# Patient Record
Sex: Male | Born: 1995 | Race: Black or African American | Hispanic: No | Marital: Single | Smoking: Never smoker
Health system: Southern US, Community
[De-identification: ages and names within clinical notes are randomized; demographics above are authoritative.]

## PROBLEM LIST (undated history)

## (undated) DIAGNOSIS — J45909 Unspecified asthma, uncomplicated: Secondary | ICD-10-CM

---

## 2018-03-29 ENCOUNTER — Encounter (HOSPITAL_COMMUNITY): Payer: Self-pay | Admitting: *Deleted

## 2018-03-29 ENCOUNTER — Emergency Department (HOSPITAL_COMMUNITY)
Admission: EM | Admit: 2018-03-29 | Discharge: 2018-03-29 | Disposition: A | Discharge status: Home / Self Care | Payer: BLUE CROSS/BLUE SHIELD | Attending: Emergency Medicine | Admitting: Emergency Medicine

## 2018-03-29 DIAGNOSIS — J069 Acute upper respiratory infection, unspecified: Secondary | ICD-10-CM | POA: Insufficient documentation

## 2018-03-29 DIAGNOSIS — R05 Cough: Secondary | ICD-10-CM | POA: Insufficient documentation

## 2018-03-29 DIAGNOSIS — J45909 Unspecified asthma, uncomplicated: Secondary | ICD-10-CM | POA: Insufficient documentation

## 2018-03-29 DIAGNOSIS — R0981 Nasal congestion: Secondary | ICD-10-CM | POA: Diagnosis present

## 2018-03-29 DIAGNOSIS — B9789 Other viral agents as the cause of diseases classified elsewhere: Secondary | ICD-10-CM

## 2018-03-29 HISTORY — DX: Unspecified asthma, uncomplicated: J45.909

## 2018-03-29 NOTE — ED Provider Notes (Signed)
  [facility] EMERGENCY DEPARTMENT Provider Note   CSN: [id number] Arrival date & time: 03/29/18  1307     History   Chief Complaint Chief Complaint  Patient presents with  . URI    HPI   Randy Hinton is a 22 y.o. male who complains of congestion, sore throat, swollen glands, dry cough, myalgias and headache for 2 days. He denies a history of anorexia, chest pain, fevers, nausea, vomiting and weight loss and denies a history of asthma. Patient does not smoke cigarettes.     HPI  Past Medical History:  Diagnosis Date  . Asthma     There are no active problems to display for this patient.   History reviewed. No pertinent surgical history.      Home Medications    Prior to Admission medications   Not on File    Family History History reviewed. No pertinent family history.  Social History Social History   Tobacco Use  . Smoking status: Never Smoker  . Smokeless tobacco: Never Used  Substance Use Topics  . Alcohol use: Not on file  . Drug use: Not on file     Allergies   Patient has no known allergies.   Review of Systems Review of Systems  Ten systems reviewed and are negative for acute change, except as noted in the HPI.   Physical Exam Updated Vital Signs BP 122/83 (BP Location: Right Arm)   Pulse 91   Temp 98 F (36.7 C) (Oral)   Resp 20   SpO2 100%   Physical Exam  Constitutional: He appears well-developed and well-nourished. No distress.  Moderately ill but nontoxic  HENT:  Head: Normocephalic and atraumatic.  Mild pharyngeal erythema without exudate Uvula midline  Eyes: Conjunctivae are normal. No scleral icterus.  Neck: Normal range of motion. Neck supple.  Cardiovascular: Normal rate, regular rhythm and normal heart sounds.  Pulmonary/Chest: Effort normal and breath sounds normal. No respiratory distress. He has no wheezes. He has no rales.  Abdominal: Soft. He exhibits no distension and no mass. There is no  tenderness. There is no guarding.  Musculoskeletal: He exhibits no edema.  Neurological: He is alert.  Skin: Skin is warm and dry. He is not diaphoretic.  Psychiatric: His behavior is normal.  Nursing note and vitals reviewed.    ED Treatments / Results  Labs (all labs ordered are listed, but only abnormal results are displayed) Labs Reviewed - No data to display  EKG None  Radiology No results found.  Procedures Procedures (including critical care time)  Medications Ordered in ED Medications - No data to display   Initial Impression / Assessment and Plan / ED Course  I have reviewed the triage vital signs and the nursing notes.  Pertinent labs & imaging results that were available during my care of the patient were reviewed by me and considered in my medical decision making (see chart for details).     Patients symptoms are consistent with URI, likely viral etiology. Discussed that antibiotics are not indicated for viral infections. Pt will be discharged with symptomatic treatment.  Verbalizes understanding and is agreeable with plan. Pt is hemodynamically stable & in NAD prior to dc.   Final Clinical Impressions(s) / ED Diagnoses   Final diagnoses:  Viral URI with cough    ED Discharge Orders    None       Arthor Captain, PA-C 03/29/18 1452    Linwood Dibbles, MD 03/31/18 [phone]

## 2018-03-29 NOTE — ED Triage Notes (Signed)
Pt in c/o headache, sore throat, body aches and congestion, mild cough, hurts to take deep breaths, no distress noted

## 2018-03-29 NOTE — Discharge Instructions (Addendum)
You appear to have an upper respiratory infection (URI). An upper respiratory tract infection, or cold, is a viral infection of the air passages leading to the lungs. It is contagious and can be spread to others, especially during the first 3 or 4 days. It cannot be cured by antibiotics or other medicines. °RETURN IMMEDIATELY IF you develop shortness of breath, confusion or altered mental status, a new rash, become dizzy, faint, or poorly responsive, or are unable to be cared for at home. ° °

## 2018-04-11 ENCOUNTER — Encounter (HOSPITAL_COMMUNITY): Payer: Self-pay

## 2018-04-11 ENCOUNTER — Emergency Department (HOSPITAL_COMMUNITY)
Admission: EM | Admit: 2018-04-11 | Discharge: 2018-04-11 | Disposition: A | Discharge status: Home / Self Care | Payer: BLUE CROSS/BLUE SHIELD | Attending: Emergency Medicine | Admitting: Emergency Medicine

## 2018-04-11 ENCOUNTER — Other Ambulatory Visit: Payer: Self-pay

## 2018-04-11 DIAGNOSIS — J029 Acute pharyngitis, unspecified: Secondary | ICD-10-CM | POA: Insufficient documentation

## 2018-04-11 DIAGNOSIS — J45909 Unspecified asthma, uncomplicated: Secondary | ICD-10-CM | POA: Insufficient documentation

## 2018-04-11 LAB — GROUP A STREP BY PCR: Group A Strep by PCR: NOT DETECTED

## 2018-04-11 MED ORDER — OXYCODONE-ACETAMINOPHEN 5-325 MG PO TABS
1.0000 | ORAL_TABLET | Freq: Once | ORAL | Status: AC
  Administered 2018-04-11: 1 via ORAL
  Filled 2018-04-11: qty 1

## 2018-04-11 MED ORDER — DEXAMETHASONE 4 MG PO TABS
10.0000 mg | ORAL_TABLET | Freq: Once | ORAL | Status: AC
  Administered 2018-04-11: 10 mg via ORAL
  Filled 2018-04-11: qty 3

## 2018-04-11 NOTE — Discharge Instructions (Addendum)
Please read attached information. If you experience any new or worsening signs or symptoms please return to the emergency room for evaluation. Please follow-up with your primary care provider or specialist as discussed. Please use medication prescribed only as directed and discontinue taking if you have any concerning signs or symptoms.   °

## 2018-04-11 NOTE — ED Provider Notes (Signed)
[facility] EMERGENCY DEPARTMENT Provider Note   CSN: [id number] Arrival date & time: 04/11/18  1144     History   Chief Complaint Chief Complaint  Patient presents with  . Sore Throat    HPI Randy Hinton is a 22 y.o. male.  HPI   22 year old male presents today with complaints of sore throat.  Patient notes symptoms started 3 days ago with painful swallowing.  Patient denies any fever, denies any cough, denies any inability to swallow.  Patient notes no close sick contacts.  He denies any other respiratory complaints including cough.  He notes a history of strep throat as a kid but none recently.  [facility] and had a rapid strep done that was negative.   Past Medical History:  Diagnosis Date  . Asthma     There are no active problems to display for this patient.   History reviewed. No pertinent surgical history.      Home Medications    Prior to Admission medications   Not on File    Family History History reviewed. No pertinent family history.  Social History Social History   Tobacco Use  . Smoking status: Never Smoker  . Smokeless tobacco: Never Used  Substance Use Topics  . Alcohol use: Not on file  . Drug use: Not on file     Allergies   Patient has no known allergies.   Review of Systems Review of Systems  All other systems reviewed and are negative.   Physical Exam Updated Vital Signs BP (!) 131/93 (BP Location: Right Arm)   Pulse 97   Temp 98.4 F (36.9 C) (Oral)   Resp 18   Ht 6' (1.829 m)   Wt 88.9 kg   SpO2 100%   BMI 26.58 kg/m   Physical Exam  Constitutional: He is oriented to person, place, and time. He appears well-developed and well-nourished.  HENT:  Head: Normocephalic and atraumatic.  Bilateral tender cervical lymphadenopathy, minor erythema to the oropharynx, bilateral tonsillar swelling symmetrical; exudate noted, uvula midline rises with phonation  Eyes:  Pupils are equal, round, and reactive to light. Conjunctivae are normal. Right eye exhibits no discharge. Left eye exhibits no discharge. No scleral icterus.  Neck: Normal range of motion. No JVD present. No tracheal deviation present.  Pulmonary/Chest: Effort normal. No stridor.  Neurological: He is alert and oriented to person, place, and time. Coordination normal.  Psychiatric: He has a normal mood and affect. His behavior is normal. Judgment and thought content normal.  Nursing note and vitals reviewed.    ED Treatments / Results  Labs (all labs ordered are listed, but only abnormal results are displayed) Labs Reviewed  GROUP A STREP BY PCR    EKG None  Radiology No results found.  Procedures Procedures (including critical care time)  Medications Ordered in ED Medications  oxyCODONE-acetaminophen (PERCOCET/ROXICET) 5-325 MG per tablet 1 tablet (1 tablet Oral Given 04/11/18 1158)  dexamethasone (DECADRON) tablet 10 mg (10 mg Oral Given 04/11/18 1158)     Initial Impression / Assessment and Plan / ED Course  I have reviewed the triage vital signs and the nursing notes.  Pertinent labs & imaging results that were available during my care of the patient were reviewed by me and considered in my medical decision making (see chart for details).     Labs: Strep  Imaging:  Consults:  Therapeutics: Percocet, Decadron  Discharge Meds:   Assessment/Plan: 22 year old male  presents today with likely viral pharyngitis.  Patient given Decadron and pain medicine here strep negative afebrile tolerating secretions and able to drink.  Discharged with symptomatic care strict return precautions.  Patient verbalized understanding and agreement to today's plan.  No signs of PTA RPA or any significant swelling.   Final Clinical Impressions(s) / ED Diagnoses   Final diagnoses:  Viral pharyngitis    ED Discharge Orders    None       Randy Hinton 04/11/18 1331      Randy Grizzle, MD 04/11/18 1906

## 2018-04-11 NOTE — ED Triage Notes (Signed)
Pt reports sore throat for 3 days.  [facility] and was [facility] it has white patches. Pt states it's painful to swallow.  Pt denies fever.

## 2019-03-27 ENCOUNTER — Emergency Department (HOSPITAL_COMMUNITY)
Admission: EM | Admit: 2019-03-27 | Discharge: 2019-03-27 | Discharge status: Absent without leave | Payer: BLUE CROSS/BLUE SHIELD

## 2019-05-03 ENCOUNTER — Emergency Department (HOSPITAL_COMMUNITY): Payer: Self-pay

## 2019-05-03 ENCOUNTER — Other Ambulatory Visit: Payer: Self-pay

## 2019-05-03 ENCOUNTER — Emergency Department (HOSPITAL_COMMUNITY)
Admission: EM | Admit: 2019-05-03 | Discharge: 2019-05-03 | Disposition: A | Discharge status: Home / Self Care | Payer: Self-pay | Attending: Emergency Medicine | Admitting: Emergency Medicine

## 2019-05-03 DIAGNOSIS — R0789 Other chest pain: Secondary | ICD-10-CM | POA: Insufficient documentation

## 2019-05-03 DIAGNOSIS — R42 Dizziness and giddiness: Secondary | ICD-10-CM | POA: Insufficient documentation

## 2019-05-03 LAB — BASIC METABOLIC PANEL
Anion gap: 11 (ref 5–15)
BUN: 14 mg/dL (ref 6–20)
CO2: 22 mmol/L (ref 22–32)
Calcium: 9.2 mg/dL (ref 8.9–10.3)
Chloride: 104 mmol/L (ref 98–111)
Creatinine, Ser: 0.94 mg/dL (ref 0.61–1.24)
GFR calc Af Amer: >60 mL/min (ref >60)
GFR calc non Af Amer: >60 mL/min (ref >60)
Glucose, Bld: 90 mg/dL (ref 70–99)
Potassium: 3.6 mmol/L (ref 3.5–5.1)
Sodium: 137 mmol/L (ref 135–145)

## 2019-05-03 LAB — CBC
HCT: 46.6 % (ref 39.0–52.0)
Hemoglobin: 15.9 g/dL (ref 13.0–17.0)
MCH: 29 pg (ref 26.0–34.0)
MCHC: 34.1 g/dL (ref 30.0–36.0)
MCV: 84.9 fL (ref 80.0–100.0)
Platelets: 219 10*3/uL (ref 150–400)
RBC: 5.49 MIL/uL (ref 4.22–5.81)
RDW: 11.9 % (ref 11.5–15.5)
WBC: 5.2 10*3/uL (ref 4.0–10.5)
nRBC: 0 % (ref 0.0–0.2)

## 2019-05-03 LAB — TROPONIN I (HIGH SENSITIVITY)
Troponin I (High Sensitivity): 4 ng/L (ref <18)
Troponin I (High Sensitivity): 4 ng/L (ref <18)

## 2019-05-03 NOTE — ED Triage Notes (Signed)
Pt stated chest pain and dizziness started 2 days ago. Denies N/V/D. Has childhood hx of seasonal allergies. States he is not feeling CP/dizziness now.

## 2019-05-03 NOTE — ED Provider Notes (Signed)
[facility] EMERGENCY DEPARTMENT Provider Note   CSN: [id number] Arrival date & time: 05/03/19  1850     History   Chief Complaint Chief Complaint  Patient presents with  . Dizziness    HPI Randy Hinton is a 23 y.o. male.     23 year old male with prior medical history as detailed below presents for evaluation of lightheadedness and intermittent chest discomfort.  Patient reports intermittent chest discomfort for the last 3 to 4 days.  He reports sharp anterior chest wall pain with movement.  He is currently without discomfort.  He denies associated fever or shortness of breath.  He denies cough.  He denies other significant prior medical history.  He is being pain-free upon evaluation.  He reports that after the first couple episodes of chest discomfort he has felt intermittently a little bit lightheaded as well.  He is not currently lightheaded.  The history is provided by the patient and medical records.  Chest Pain Chest pain location: anterior chest wall. Pain quality: sharp and shooting   Pain radiates to:  Does not radiate Pain severity:  No pain Onset quality:  Sudden Duration:  4 days Timing:  Intermittent Progression:  Resolved Chronicity:  New Context: movement   Relieved by:  Nothing Worsened by:  Nothing   Past Medical History:  Diagnosis Date  . Asthma     There are no active problems to display for this patient.   No past surgical history on file.      Home Medications    Prior to Admission medications   Not on File    Family History No family history on file.  Social History Social History   Tobacco Use  . Smoking status: Never Smoker  . Smokeless tobacco: Never Used  Substance Use Topics  . Alcohol use: Not on file  . Drug use: Not on file     Allergies   Patient has no known allergies.   Review of Systems Review of Systems  Cardiovascular: Positive for chest pain.  All other systems reviewed and are  negative.    Physical Exam Updated Vital Signs BP 139/90 (BP Location: Right Arm)   Pulse 61   Temp 98.3 F (36.8 C) (Oral)   Resp 16   Ht 6\' 1"  (1.854 m)   Wt 104.3 kg   SpO2 100%   BMI 30.34 kg/m   Physical Exam Vitals signs and nursing note reviewed.  Constitutional:      General: He is not in acute distress.    Appearance: Normal appearance. He is well-developed.  HENT:     Head: Normocephalic and atraumatic.  Eyes:     Conjunctiva/sclera: Conjunctivae normal.     Pupils: Pupils are equal, round, and reactive to light.  Neck:     Musculoskeletal: Normal range of motion and neck supple.  Cardiovascular:     Rate and Rhythm: Normal rate and regular rhythm.     Heart sounds: Normal heart sounds.  Pulmonary:     Effort: Pulmonary effort is normal. No respiratory distress.     Breath sounds: Normal breath sounds.  Abdominal:     General: There is no distension.     Palpations: Abdomen is soft.     Tenderness: There is no abdominal tenderness.  Musculoskeletal: Normal range of motion.        General: No deformity.  Skin:    General: Skin is warm and dry.  Neurological:     General: No focal  deficit present.     Mental Status: He is alert and oriented to person, place, and time. Mental status is at baseline.      ED Treatments / Results  Labs (all labs ordered are listed, but only abnormal results are displayed) Labs Reviewed  BASIC METABOLIC PANEL  CBC  TROPONIN I (HIGH SENSITIVITY)  TROPONIN I (HIGH SENSITIVITY)    EKG None  Radiology Dg Chest 2 View  Result Date: 05/03/2019 CLINICAL DATA:  Chest pain EXAM: CHEST - 2 VIEW COMPARISON:  None. FINDINGS: The heart size and mediastinal contours are within normal limits. Both lungs are clear. The visualized skeletal structures are unremarkable. IMPRESSION: No active cardiopulmonary disease. Electronically Signed   By: Jasmine Pang M.D.   On: 05/03/2019 19:38    Procedures Procedures (including critical  care time)  Medications Ordered in ED Medications - No data to display   Initial Impression / Assessment and Plan / ED Course  I have reviewed the triage vital signs and the nursing notes.  Pertinent labs & imaging results that were available during my care of the patient were reviewed by me and considered in my medical decision making (see chart for details).        MDM  Screen complete  Randy Hinton was evaluated in Emergency Department on 05/03/2019 for the symptoms described in the history of present illness. He was evaluated in the context of the global COVID-19 pandemic, which necessitated consideration that the patient might be at risk for infection with the SARS-CoV-2 virus that causes COVID-19. Institutional protocols and algorithms that pertain to the evaluation of patients at risk for COVID-19 are in a state of rapid change based on information released by regulatory bodies including the CDC and federal and state organizations. These policies and algorithms were followed during the patient's care in the ED.  Patient is presenting for evaluation of atypical chest discomfort.  Symptoms are resolved upon his evaluation in the ED.  Initial description of symptoms is not consistent with likely ACS.  EKG is without evidence of acute ischemia.  Troponin x2 is negative.  Patient's heart score is 0.  Patient is appropriate for discharge.  He does understand need for close follow-up.  Strict return precautions given and understood.  Final Clinical Impressions(s) / ED Diagnoses   Final diagnoses:  Atypical chest pain    ED Discharge Orders    None       Wynetta Fines, MD 05/03/19 2314

## 2019-05-03 NOTE — ED Notes (Signed)
Pt verbalized understanding of d/cancer instructions and follow up care. No futher questions at this time

## 2019-05-03 NOTE — Discharge Instructions (Addendum)
Please return for any problem.  Follow-up with your regular care provider as instructed. °

## 2020-11-10 IMAGING — DX DG CHEST 2V
2 series · 2 of 2 positions shown · non-contrast
Comparison: None.

CLINICAL DATA: Chest pain

EXAM:
CHEST - 2 VIEW

[w chest pa]
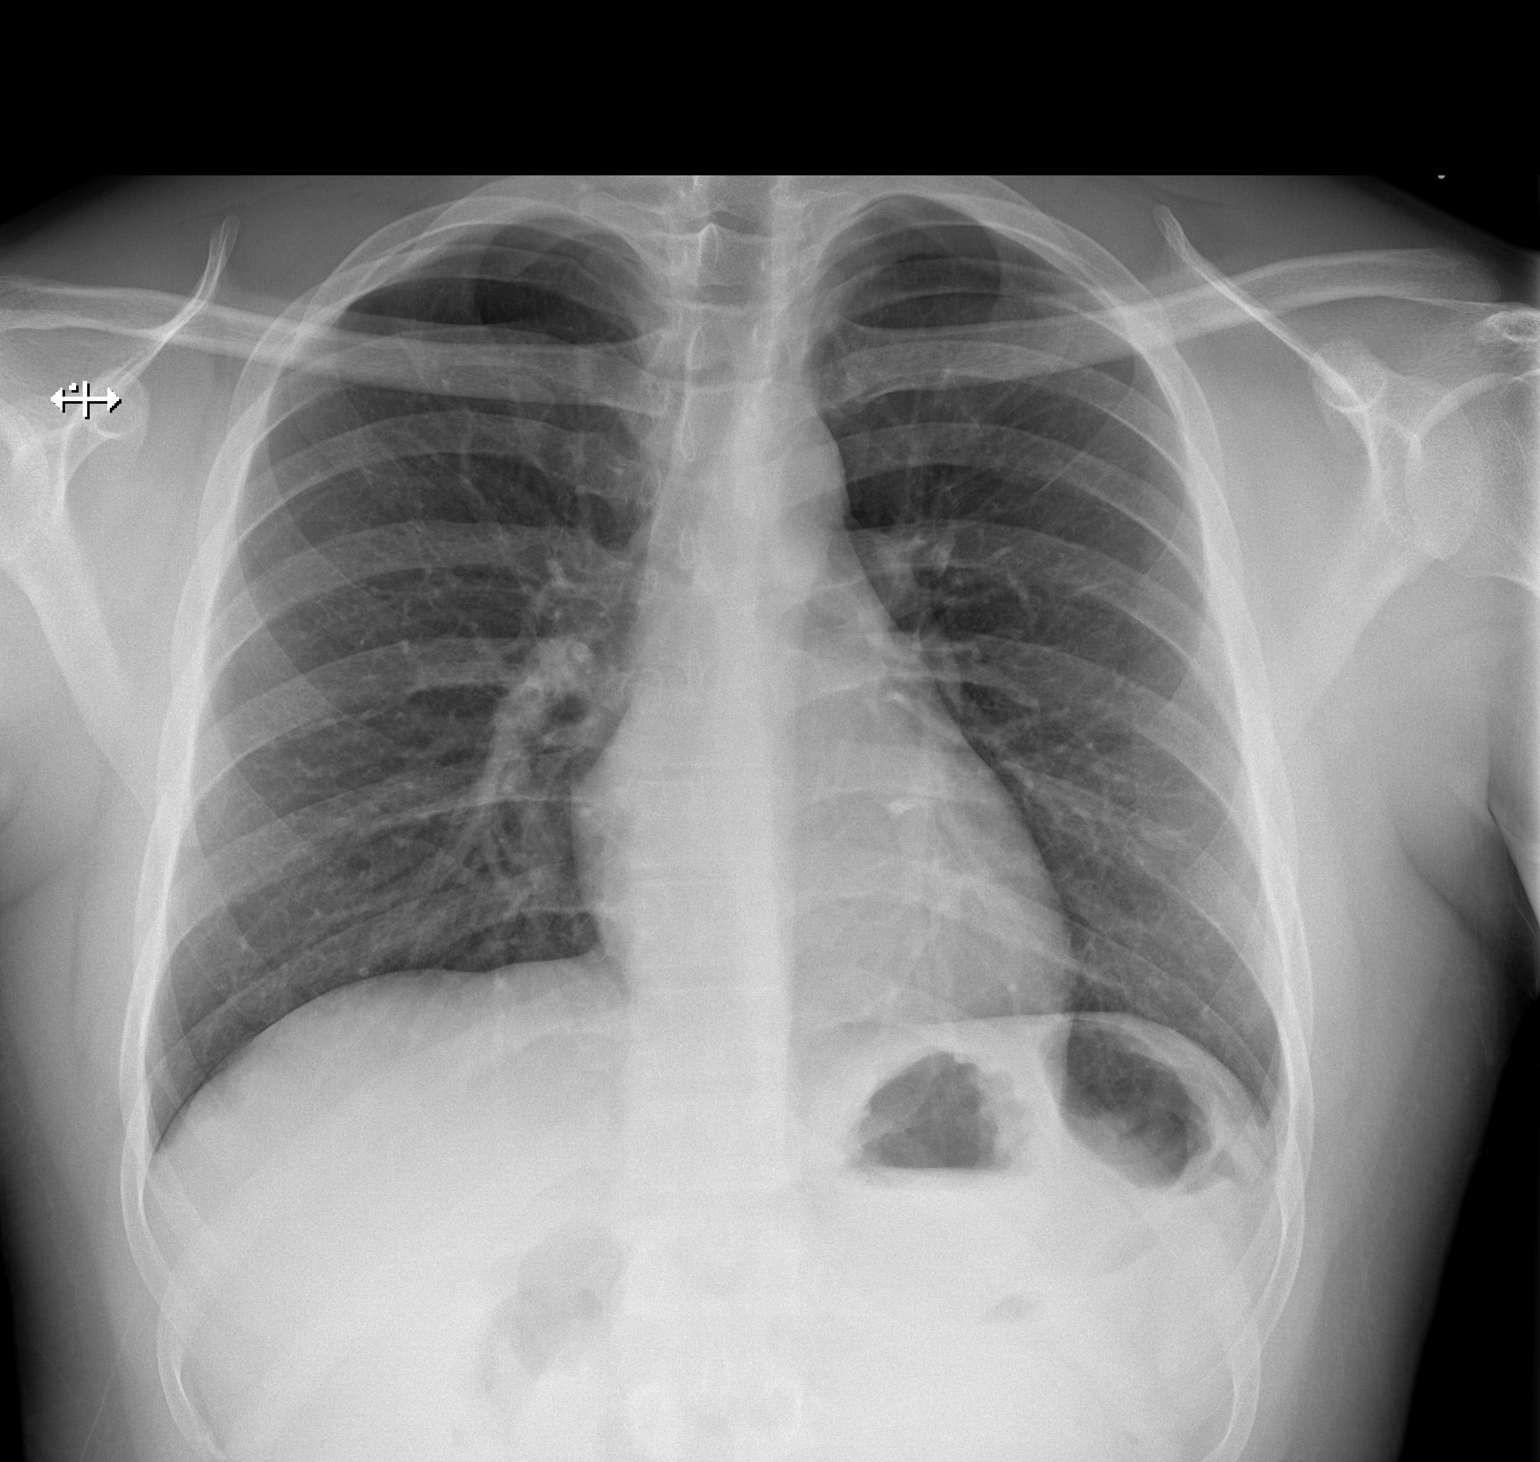

[w chest lat]
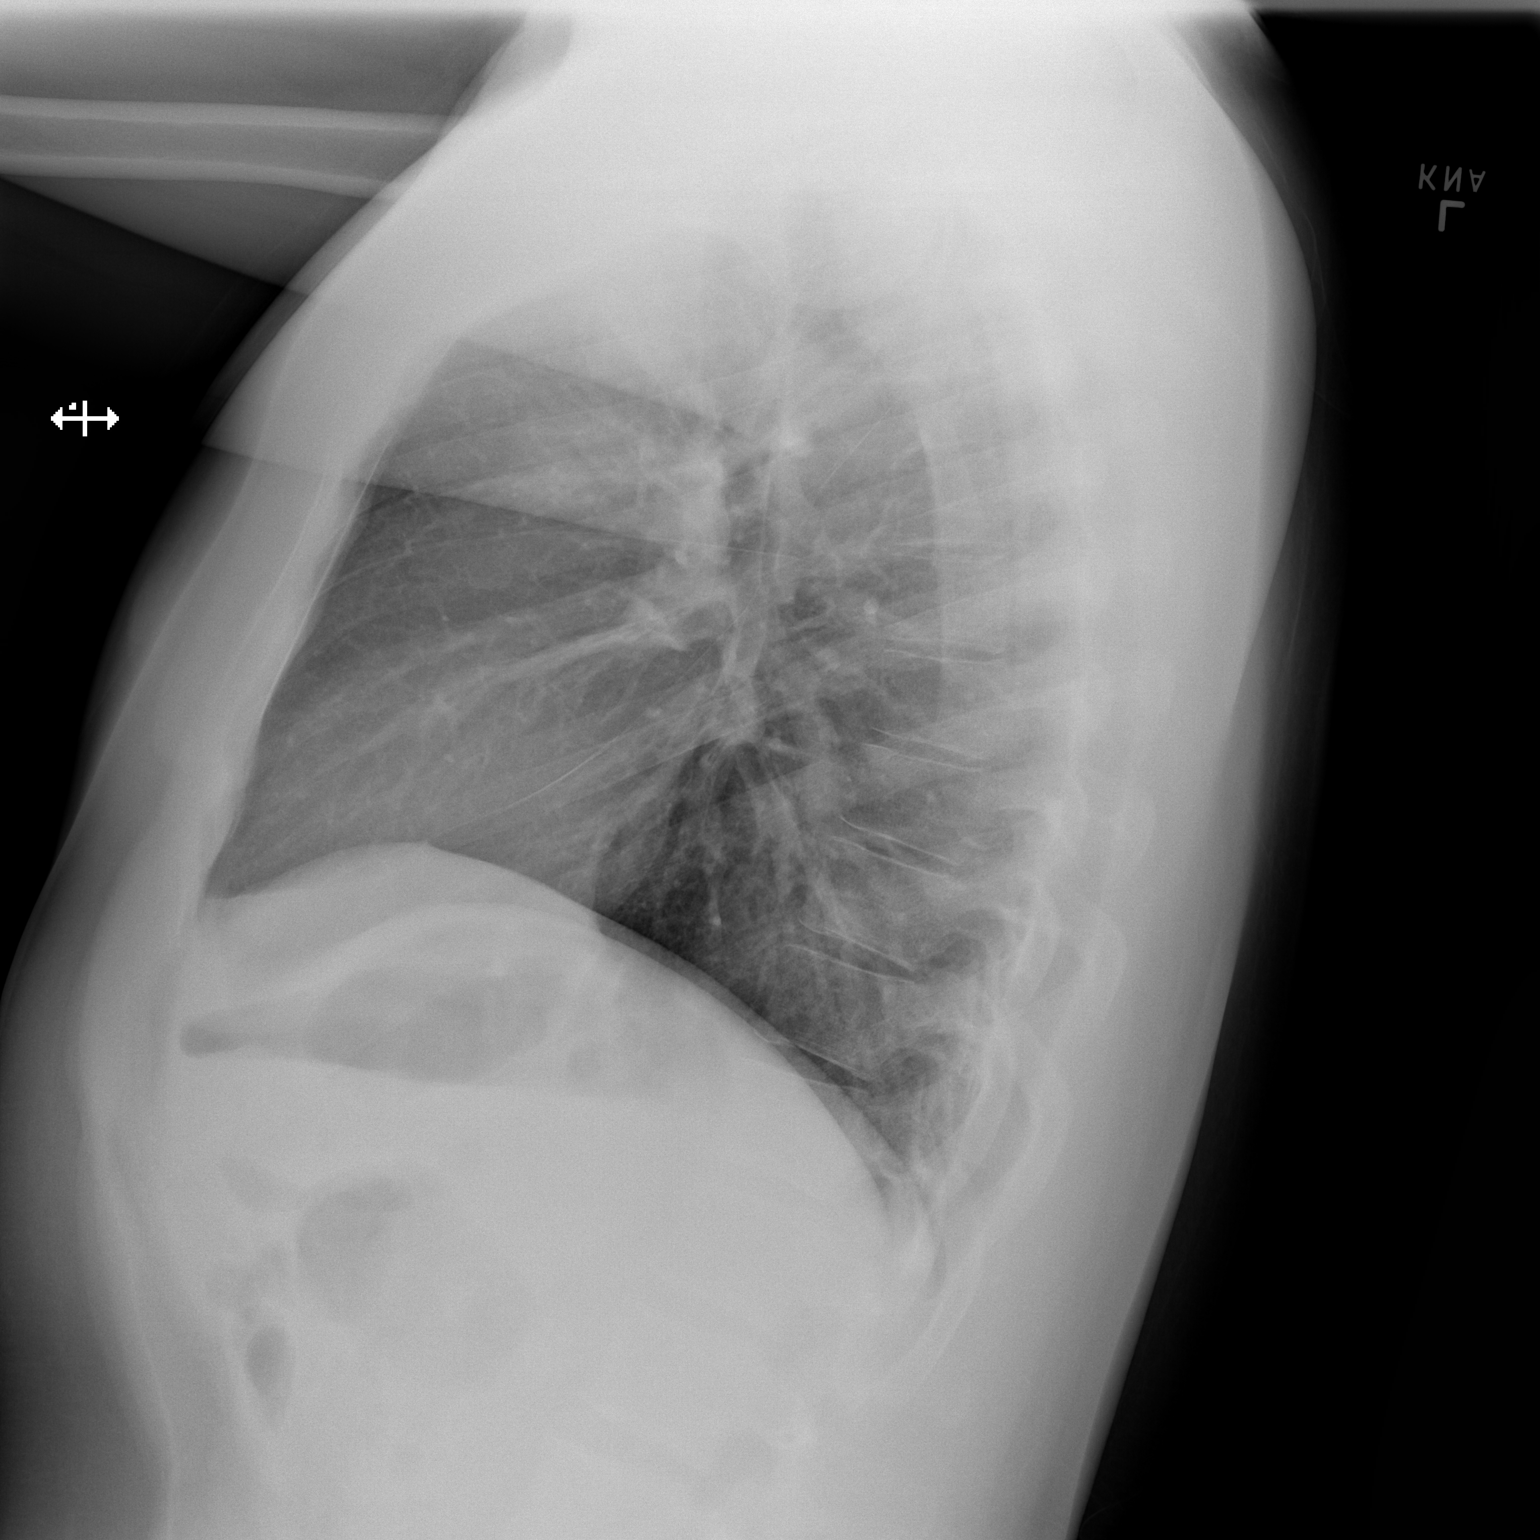

[2 of 2 positions shown; findings below may reference images not displayed]

FINDINGS: The heart size and mediastinal contours are within normal limits.
Both lungs are clear. The visualized skeletal structures are
unremarkable.
IMPRESSION: No active cardiopulmonary disease.
